# Patient Record
Sex: Male | Born: 1965 | Race: Black or African American | Hispanic: No | Marital: Married | Smoking: Current every day smoker
Health system: Southern US, Community
[De-identification: ages and names within clinical notes are randomized; demographics above are authoritative.]

## PROBLEM LIST (undated history)

## (undated) ENCOUNTER — Ambulatory Visit (HOSPITAL_COMMUNITY): Discharge status: Absent without leave | Payer: BLUE CROSS/BLUE SHIELD

## (undated) ENCOUNTER — Ambulatory Visit (HOSPITAL_COMMUNITY): Admission: EM | Disposition: A | Discharge status: Home / Self Care | Payer: BC Managed Care – PPO

## (undated) DIAGNOSIS — I10 Essential (primary) hypertension: Secondary | ICD-10-CM

## (undated) HISTORY — DX: Essential (primary) hypertension: I10

---

## 1997-11-14 ENCOUNTER — Encounter: Admission: RE | Admit: 1997-11-14 | Discharge: 1997-11-14 | Payer: Self-pay | Admitting: Family Medicine

## 1998-07-04 ENCOUNTER — Encounter: Admission: RE | Admit: 1998-07-04 | Discharge: 1998-07-04 | Payer: Self-pay | Admitting: Sports Medicine

## 1998-08-02 ENCOUNTER — Encounter: Admission: RE | Admit: 1998-08-02 | Discharge: 1998-08-02 | Payer: Self-pay | Admitting: Family Medicine

## 2000-06-12 ENCOUNTER — Encounter: Admission: RE | Admit: 2000-06-12 | Discharge: 2000-06-12 | Payer: Self-pay | Admitting: Family Medicine

## 2000-06-18 ENCOUNTER — Encounter: Admission: RE | Admit: 2000-06-18 | Discharge: 2000-06-18 | Payer: Self-pay | Admitting: Family Medicine

## 2000-11-08 ENCOUNTER — Emergency Department (HOSPITAL_COMMUNITY): Admission: EM | Admit: 2000-11-08 | Discharge: 2000-11-08 | Payer: Self-pay | Admitting: Emergency Medicine

## 2000-12-04 ENCOUNTER — Encounter: Admission: RE | Admit: 2000-12-04 | Discharge: 2000-12-04 | Payer: Self-pay | Admitting: Family Medicine

## 2001-12-11 ENCOUNTER — Encounter: Admission: RE | Admit: 2001-12-11 | Discharge: 2001-12-11 | Payer: Self-pay | Admitting: Family Medicine

## 2002-09-21 ENCOUNTER — Encounter: Admission: RE | Admit: 2002-09-21 | Discharge: 2002-09-21 | Payer: Self-pay | Admitting: Family Medicine

## 2002-10-05 ENCOUNTER — Encounter: Admission: RE | Admit: 2002-10-05 | Discharge: 2002-10-05 | Payer: Self-pay | Admitting: Family Medicine

## 2003-11-30 ENCOUNTER — Encounter: Admission: RE | Admit: 2003-11-30 | Discharge: 2003-11-30 | Payer: Self-pay | Admitting: Sports Medicine

## 2004-02-11 ENCOUNTER — Emergency Department (HOSPITAL_COMMUNITY): Admission: EM | Admit: 2004-02-11 | Discharge: 2004-02-11 | Payer: Self-pay | Admitting: Family Medicine

## 2005-06-07 ENCOUNTER — Ambulatory Visit: Payer: Self-pay | Admitting: Family Medicine

## 2005-06-18 ENCOUNTER — Encounter: Admission: RE | Admit: 2005-06-18 | Discharge: 2005-06-18 | Payer: Self-pay | Admitting: Sports Medicine

## 2005-08-27 ENCOUNTER — Ambulatory Visit: Payer: Self-pay | Admitting: Family Medicine

## 2005-10-25 ENCOUNTER — Emergency Department (HOSPITAL_COMMUNITY): Admission: EM | Admit: 2005-10-25 | Discharge: 2005-10-25 | Payer: Self-pay | Admitting: Family Medicine

## 2006-06-26 DIAGNOSIS — F17200 Nicotine dependence, unspecified, uncomplicated: Secondary | ICD-10-CM | POA: Insufficient documentation

## 2006-06-26 DIAGNOSIS — Q539 Undescended testicle, unspecified: Secondary | ICD-10-CM

## 2006-08-21 ENCOUNTER — Emergency Department (HOSPITAL_COMMUNITY): Admission: EM | Admit: 2006-08-21 | Discharge: 2006-08-21 | Payer: Self-pay | Admitting: Family Medicine

## 2008-07-18 ENCOUNTER — Ambulatory Visit: Payer: Self-pay | Admitting: Internal Medicine

## 2008-07-18 ENCOUNTER — Encounter (INDEPENDENT_AMBULATORY_CARE_PROVIDER_SITE_OTHER): Payer: Self-pay | Admitting: Nurse Practitioner

## 2008-07-18 DIAGNOSIS — I10 Essential (primary) hypertension: Secondary | ICD-10-CM | POA: Insufficient documentation

## 2008-08-22 ENCOUNTER — Ambulatory Visit: Payer: Self-pay | Admitting: Nurse Practitioner

## 2008-08-22 DIAGNOSIS — I498 Other specified cardiac arrhythmias: Secondary | ICD-10-CM

## 2008-08-22 DIAGNOSIS — H6120 Impacted cerumen, unspecified ear: Secondary | ICD-10-CM

## 2008-08-23 ENCOUNTER — Encounter (INDEPENDENT_AMBULATORY_CARE_PROVIDER_SITE_OTHER): Payer: Self-pay | Admitting: Nurse Practitioner

## 2008-08-23 ENCOUNTER — Telehealth (INDEPENDENT_AMBULATORY_CARE_PROVIDER_SITE_OTHER): Payer: Self-pay | Admitting: Nurse Practitioner

## 2008-08-29 ENCOUNTER — Ambulatory Visit: Payer: Self-pay | Admitting: Nurse Practitioner

## 2008-08-30 ENCOUNTER — Encounter (INDEPENDENT_AMBULATORY_CARE_PROVIDER_SITE_OTHER): Payer: Self-pay | Admitting: Nurse Practitioner

## 2008-08-31 DIAGNOSIS — Z8619 Personal history of other infectious and parasitic diseases: Secondary | ICD-10-CM

## 2008-08-31 DIAGNOSIS — A5400 Gonococcal infection of lower genitourinary tract, unspecified: Secondary | ICD-10-CM

## 2008-08-31 LAB — CONVERTED CEMR LAB
Chlamydia, Swab/Urine, PCR: POSITIVE — AB
GC Probe Amp, Urine: POSITIVE — AB
HDL: 44 mg/dL (ref >39)
Total CHOL/HDL Ratio: 4.1

## 2008-09-02 ENCOUNTER — Encounter (INDEPENDENT_AMBULATORY_CARE_PROVIDER_SITE_OTHER): Payer: Self-pay | Admitting: Nurse Practitioner

## 2008-09-05 ENCOUNTER — Ambulatory Visit: Payer: Self-pay | Admitting: *Deleted

## 2008-09-19 ENCOUNTER — Ambulatory Visit: Payer: Self-pay | Admitting: Nurse Practitioner

## 2009-02-06 ENCOUNTER — Ambulatory Visit: Payer: Self-pay | Admitting: Physician Assistant

## 2009-02-06 DIAGNOSIS — J329 Chronic sinusitis, unspecified: Secondary | ICD-10-CM | POA: Insufficient documentation

## 2009-02-06 DIAGNOSIS — R110 Nausea: Secondary | ICD-10-CM | POA: Insufficient documentation

## 2009-02-06 DIAGNOSIS — N342 Other urethritis: Secondary | ICD-10-CM | POA: Insufficient documentation

## 2009-02-06 LAB — CONVERTED CEMR LAB
Bilirubin Urine: NEGATIVE
Glucose, Urine, Semiquant: NEGATIVE
Ketones, urine, test strip: NEGATIVE
Nitrite: NEGATIVE
Specific Gravity, Urine: >=1.03
Urobilinogen, UA: 0.2
pH: 5.5

## 2009-02-10 LAB — CONVERTED CEMR LAB: GC Probe Amp, Urine: NEGATIVE

## 2010-02-01 ENCOUNTER — Encounter (INDEPENDENT_AMBULATORY_CARE_PROVIDER_SITE_OTHER): Payer: Self-pay | Admitting: *Deleted

## 2010-02-01 ENCOUNTER — Telehealth: Payer: Self-pay | Admitting: Physician Assistant

## 2010-02-06 ENCOUNTER — Telehealth: Payer: Self-pay | Admitting: Physician Assistant

## 2010-02-07 ENCOUNTER — Encounter (INDEPENDENT_AMBULATORY_CARE_PROVIDER_SITE_OTHER): Payer: Self-pay | Admitting: Nurse Practitioner

## 2010-02-13 ENCOUNTER — Telehealth: Payer: Self-pay | Admitting: Physician Assistant

## 2010-04-27 ENCOUNTER — Ambulatory Visit: Payer: Self-pay | Admitting: Nurse Practitioner

## 2010-04-27 ENCOUNTER — Encounter (INDEPENDENT_AMBULATORY_CARE_PROVIDER_SITE_OTHER): Payer: Self-pay | Admitting: Nurse Practitioner

## 2010-04-27 DIAGNOSIS — M25569 Pain in unspecified knee: Secondary | ICD-10-CM

## 2010-04-27 LAB — CONVERTED CEMR LAB
ALT: 19 units/L (ref 0–53)
AST: 19 units/L (ref 0–37)
Albumin: 4.5 g/dL (ref 3.5–5.2)
Basophils Absolute: 0 10*3/uL (ref 0.0–0.1)
Bilirubin Urine: NEGATIVE
Calcium: 9.6 mg/dL (ref 8.4–10.5)
Chloride: 104 meq/L (ref 96–112)
Creatinine, Ser: 0.87 mg/dL (ref 0.40–1.50)
Eosinophils Absolute: 0.2 10*3/uL (ref 0.0–0.7)
Eosinophils Relative: 3 % (ref 0–5)
Hemoglobin: 14.3 g/dL (ref 13.0–17.0)
Lymphocytes Relative: 43 % (ref 12–46)
MCHC: 32.4 g/dL (ref 30.0–36.0)
Monocytes Absolute: 0.6 10*3/uL (ref 0.1–1.0)
Neutrophils Relative %: 42 % — ABNORMAL LOW (ref 43–77)
Platelets: 324 10*3/uL (ref 150–400)
Potassium: 4.1 meq/L (ref 3.5–5.3)
Protein, U semiquant: 30
RDW: 13.6 % (ref 11.5–15.5)
Sodium: 139 meq/L (ref 135–145)
Total Bilirubin: 0.4 mg/dL (ref 0.3–1.2)
Total Protein: 7.9 g/dL (ref 6.0–8.3)
WBC: 5.3 10*3/uL (ref 4.0–10.5)

## 2010-05-03 ENCOUNTER — Encounter (INDEPENDENT_AMBULATORY_CARE_PROVIDER_SITE_OTHER): Payer: Self-pay | Admitting: Nurse Practitioner

## 2010-05-25 ENCOUNTER — Ambulatory Visit: Admit: 2010-05-25 | Payer: Self-pay | Admitting: Nurse Practitioner

## 2010-05-27 LAB — CONVERTED CEMR LAB
AST: 15 units/L (ref 0–37)
Albumin: 4.7 g/dL (ref 3.5–5.2)
Basophils Relative: 0 % (ref 0–1)
Blood in Urine, dipstick: NEGATIVE
Eosinophils Absolute: 0.1 10*3/uL (ref 0.0–0.7)
Glucose, Urine, Semiquant: NEGATIVE
HCT: 42.5 % (ref 39.0–52.0)
Lymphs Abs: 2.4 10*3/uL (ref 0.7–4.0)
MCV: 88 fL (ref 78.0–100.0)
Monocytes Absolute: 0.8 10*3/uL (ref 0.1–1.0)
Monocytes Relative: 12 % (ref 3–12)
Neutro Abs: 3.6 10*3/uL (ref 1.7–7.7)
Nitrite: NEGATIVE
Platelets: 353 10*3/uL (ref 150–400)
Protein, U semiquant: 30
RDW: 13.2 % (ref 11.5–15.5)
Specific Gravity, Urine: 1.02
Total Protein: 8.1 g/dL (ref 6.0–8.3)

## 2010-05-29 NOTE — Progress Notes (Signed)
Summary: Toprol  Phone Note Other Incoming   Summary of Call: Rec'd request for refill on Toprol. Patient not seen since Oct. No refills requested on Toprol since June. He needs appt for f/u on HTN.  Initial call taken by: Tereso Newcomer PA-C,  February 01, 2010 8:02 AM  Follow-up for Phone Call        number is disconnected. ... will mail letter... Arm[place] Shannon  February 01, 2010 2:44 PM

## 2010-05-29 NOTE — Progress Notes (Signed)
Summary: Toprol  Phone Note Outgoing Call   Summary of Call: Please notify [facility] that patient needs appt before Toprol can be refilled.  Initial call taken by: Brynda Rim,  February 13, 2010 8:34 AM  Follow-up for Phone Call        pharmacy is aware Follow-up by: Arm[place] Shannon,  February 13, 2010 10:54 AM

## 2010-05-29 NOTE — Progress Notes (Signed)
Summary: [facility]  Phone Note Refill Request   Refills Requested: Medication #1:  TOPROL XL 25 MG XR24H-TAB One tablet by mouth daily for blood pressure/heart he wants to [facility], [facility]   Initial call taken by: Domenic Polite,  February 06, 2010 12:24 PM  Follow-up for Phone Call        "Code or number is incorrect"- unable to leave message.  Dutch Quint RN  February 06, 2010 2:31 PM  Phone number not valid.  Letter sent.  Dutch Quint RN  February 07, 2010 11:43 AM

## 2010-05-29 NOTE — Letter (Signed)
Summary: *HSN Results Follow up  [facility]  [address] [place], [place] [address]   Phone: [phone]  Fax: [phone]      02/01/2010   Corey Gonzales [address] [place], [place]  [address]   Dear  Mr. Corey Gonzales,                            ____S.Drinkard,FNP   ____D. Gore,FNP       ____B. McPherson,MD   ____V. Rankins,MD    ____E. Mulberry,MD    ____N. Daphine Deutscher, FNP  ____D. Reche Dixon, MD    ____K. Philipp Deputy, MD    ____Other     This letter is to inform you that your recent test(s):  _______Pap Smear    _______Lab Test     _______X-ray    _______ is within acceptable limits  _______ requires a medication change  _______ requires a follow-up lab visit  ___X____ requires a follow-up visit with your Corey Gonzales   Comments:  We have been trying to reach you.  Please give the office a call at your earliest convenience.       _________________________________________________________ If you have any questions, please contact our office                     Sincerely,  Arm[place] Shannon [facility]

## 2010-05-29 NOTE — Letter (Signed)
Summary: Generic Letter  [facility]  [address] [place], [place] [address]   Phone: [phone]  Fax: [phone]        02/07/2010  ELJAY LAVE [address] [place], [place]  [address]  Dear Mr. Zinn,  We have been unable to contact you by telephone.  Please call our office, at your earliest convenience, so that we may speak with you.   Sincerely,   Dutch Quint RN

## 2010-05-31 NOTE — Letter (Signed)
Summary: *HSN Results Follow up  [facility]  [address] [place], [place] [address]   Phone: [phone]  Fax: [phone]      05/03/2010   Corey Gonzales [address] [place], [place]  [address]   Dear  Mr. JJESUS DINGLEY,                            ____S.Drinkard,FNP   ____D. Gore,FNP       ____B. McPherson,MD   ____V. Rankins,MD    ____E. Mulberry,MD    __X__N. Daphine Deutscher, FNP  ____D. Reche Dixon, MD    ____K. Philipp Deputy, MD    ____Other     This letter is to inform you that your recent test(s):  _______Pap Smear    ___X____Lab Test     _______X-ray    ___X____ is within acceptable limits  _______ requires a medication change  _______ requires a follow-up lab visit  _______ requires a follow-up visit with your provider   Comments: Labs done during recent office visit are normal.       _________________________________________________________ If you have any questions, please contact our office [phone].                    Sincerely,    Lehman Prom FNP [facility]

## 2010-05-31 NOTE — Assessment & Plan Note (Signed)
Summary: HTN   Vital Signs:  Patient profile:   45 year old male Weight:      211.4 pounds BMI:     28.38 Temp:     97.8 degrees F oral Pulse rate:   72 / minute Pulse rhythm:   regular Resp:     16 per minute BP sitting:   140 / 90  (left arm) Cuff size:   large  Vitals Entered By: Levon Hedger (April 27, 2010 11:59 AM)  Nutrition Counseling: Patient's BMI is greater than 25 and therefore counseled on weight management options. CC: follow-up visit HTN..circulation in leg feels like it is asleep with pai in the knee and popping, Hypertension Management Is Patient Diabetic? No Pain Assessment Patient in pain? no       Does patient need assistance? Functional Status Self care Ambulation Normal   CC:  follow-up visit HTN..circulation in leg feels like it is asleep with pai in the knee and popping and Hypertension Management.  History of Present Illness:  Pt into the office to f/u on HTN Pt has eaten today so was not able to get cholesterol   Hypertension History:      He denies headache, chest pain, and palpitations.  no current medications - pt as previously taking toprol but bp normalized so he quit.  he also admits that toprol caused some ED symptoms.        Positive major cardiovascular risk factors include hypertension and current tobacco user.  Negative major cardiovascular risk factors include male age less than 70 years old and no history of diabetes or hyperlipidemia.        Further assessment for target organ damage reveals no history of ASHD, cardiac end-organ damage (CHF/LVH), stroke/TIA, peripheral vascular disease, renal insufficiency, or hypertensive retinopathy.     Allergies (verified): No Known Drug Allergies  Review of Systems CV:  Denies chest pain or discomfort. Resp:  Denies cough. GI:  Denies abdominal pain, nausea, and vomiting. MS:  Complains of joint pain; right knee - ongoing pain behind knee cap and popping with flexion.  Physical  Exam  General:  alert.   Head:  normocephalic.   Lungs:  normal breath sounds.   Heart:  normal rate and regular rhythm.   Abdomen:  normal bowel sounds.   Msk:  normal ROM.   Neurologic:  alert & oriented X3.   Skin:  multiple tattoos Psych:  Oriented X3.     Impression & Recommendations:  Problem # 1:  HYPERTENSION, BENIGN ESSENTIAL (ICD-401.1) DASH diet The following medications were removed from the medication list:    Toprol Xl 25 Mg Xr24h-tab (Metoprolol succinate) ..... One tablet by mouth daily for blood pressure/heart  Problem # 2:  KNEE PAIN (ICD-719.46) advised pt this is likely overuse may take tylenol as needed   Other Orders: T-Comprehensive Metabolic Panel (11914-78295) T-CBC w/Diff (62130-86578) Rapid HIV  (46962) T-TSH (95284-13244) UA Dipstick w/o Micro (manual) (01027) T-Urine Microalbumin w/creat. ratio [phone])  Hypertension Assessment/Plan:      The patient's hypertensive risk group is category B: At least one risk factor (excluding diabetes) with no target organ damage.  His calculated 10 year risk of coronary heart disease is 14 %.  Today's blood pressure is 140/90.  His blood pressure goal is < 140/90.  Patient Instructions: 1)  Schedule an appointment with the Triage Nurse in 4 weeks. 2)  Come fasting after midnight before this visit. 3)  No food but you may drink water. 4)  You will need blood pressure check and lipids. 5)  Goal 135/85. 6)  If still elevated you will need medications. 7)  You have been given the flu vaccine today.     Orders Added: 1)  Est. Patient Level III [60109] 2)  T-Comprehensive Metabolic Panel [80053-22900] 3)  T-CBC w/Diff [32355-73220] 4)  Rapid HIV  [92370] 5)  T-TSH [25427-06237] 6)  UA Dipstick w/o Micro (manual) [81002] 7)  T-Urine Microalbumin w/creat. ratio [[id number]]      Laboratory Results   Urine Tests  Date/Time Received: April 27, 2010 12:24 PM   Routine Urinalysis     Color: lt. yellow Appearance: Clear Glucose: negative   (Normal Range: Negative) Bilirubin: negative   (Normal Range: Negative) Ketone: negative   (Normal Range: Negative) Spec. Gravity: >=1.030   (Normal Range: 1.003-1.035) Blood: negative   (Normal Range: Negative) pH: 5.0   (Normal Range: 5.0-8.0) Protein: 30   (Normal Range: Negative) Urobilinogen: 0.2   (Normal Range: 0-1) Nitrite: negative   (Normal Range: Negative) Leukocyte Esterace: trace   (Normal Range: Negative)      Other Tests  Rapid HIV: negative    Laboratory Results   Urine Tests    Routine Urinalysis   Color: lt. yellow Appearance: Clear Glucose: negative   (Normal Range: Negative) Bilirubin: negative   (Normal Range: Negative) Ketone: negative   (Normal Range: Negative) Spec. Gravity: >=1.030   (Normal Range: 1.003-1.035) Blood: negative   (Normal Range: Negative) pH: 5.0   (Normal Range: 5.0-8.0) Protein: 30   (Normal Range: Negative) Urobilinogen: 0.2   (Normal Range: 0-1) Nitrite: negative   (Normal Range: Negative) Leukocyte Esterace: trace   (Normal Range: Negative)      Other Tests  Rapid HIV: negative

## 2014-03-18 ENCOUNTER — Emergency Department (INDEPENDENT_AMBULATORY_CARE_PROVIDER_SITE_OTHER)
Admission: EM | Admit: 2014-03-18 | Discharge: 2014-03-18 | Disposition: A | Discharge status: Home / Self Care | Payer: Self-pay | Source: Home / Self Care | Attending: Family Medicine | Admitting: Family Medicine

## 2014-03-18 ENCOUNTER — Encounter (HOSPITAL_COMMUNITY): Payer: Self-pay | Admitting: Emergency Medicine

## 2014-03-18 DIAGNOSIS — G510 Bell's palsy: Secondary | ICD-10-CM

## 2014-03-18 MED ORDER — PREDNISONE 20 MG PO TABS: ORAL_TABLET | ORAL | Status: DC

## 2014-03-18 MED ORDER — POLYETHYL GLYCOL-PROPYL GLYCOL 0.4-0.3 % OP SOLN: OPHTHALMIC | Status: DC

## 2014-03-18 MED ORDER — PREDNISONE 20 MG PO TABS
80.0000 mg | ORAL_TABLET | Freq: Once | ORAL | Status: AC
  Administered 2014-03-18: 80 mg via ORAL

## 2014-03-18 MED ORDER — VALACYCLOVIR HCL 1 G PO TABS: 1000.0000 mg | ORAL_TABLET | Freq: Three times a day (TID) | ORAL | Status: AC

## 2014-03-18 MED ORDER — PREDNISONE 20 MG PO TABS
ORAL_TABLET | ORAL | Status: AC
  Filled 2014-03-18: qty 4

## 2014-03-18 NOTE — Discharge Instructions (Signed)
Bell's Palsy °Bell's palsy is a condition in which the muscles on one side of the face cannot move (paralysis). This is because the nerves in the face are paralyzed. It is most often thought to be caused by a virus. The virus causes swelling of the nerve that controls movement on one side of the face. The nerve travels through a tight space surrounded by bone. When the nerve swells, it can be compressed by the bone. This results in damage to the protective covering around the nerve. This damage interferes with how the nerve communicates with the muscles of the face. As a result, it can cause weakness or paralysis of the facial muscles.  °Injury (trauma), tumor, and surgery may cause Bell's palsy, but most of the time the cause is unknown. It is a relatively common condition. It starts suddenly (abrupt onset) with the paralysis usually ending within 2 days. Bell's palsy is not dangerous. But because the eye does not close properly, you may need care to keep the eye from getting dry. This can include splinting (to keep the eye shut) or moistening with artificial tears. Bell's palsy very seldom occurs on both sides of the face at the same time. °SYMPTOMS  °· Eyebrow sagging. °· Drooping of the eyelid and corner of the mouth. °· Inability to close one eye. °· Loss of taste on the front of the tongue. °· Sensitivity to loud noises. °TREATMENT  °The treatment is usually non-surgical. If the patient is seen within the first 24 to 48 hours, a short course of steroids may be prescribed, in an attempt to shorten the length of the condition. Antiviral medicines may also be used with the steroids, but it is unclear if they are helpful.  °You will need to protect your eye, if you cannot close it. The cornea (clear covering over your eye) will become dry and can be damaged. Artificial tears can be used to keep your eye moist. Glasses or an eye patch should be worn to protect your eye. °PROGNOSIS  °Recovery is variable, ranging  from days to months. Although the problem usually goes away completely (about 80% of cases resolve), predicting the outcome is impossible. Most people improve within 3 weeks of when the symptoms began. Improvement may continue for 3 to 6 months. A small number of people have moderate to severe weakness that is permanent.  °HOME CARE INSTRUCTIONS  °· If your caregiver prescribed medication to reduce swelling in the nerve, use as directed. Do not stop taking the medication unless directed by your caregiver. °· Use moisturizing eye drops as needed to prevent drying of your eye, as directed by your caregiver. °· Protect your eye, as directed by your caregiver. °· Use facial massage and exercises, as directed by your caregiver. °· Perform your normal activities, and get your normal rest. °SEEK IMMEDIATE MEDICAL CARE IF:  °· There is pain, redness or irritation in the eye. °· You or your child has an oral temperature above 102° F (38.9° C), not controlled by medicine. °MAKE SURE YOU:  °· Understand these instructions. °· Will watch your condition. °· Will get help right away if you are not doing well or get worse. °Document Released: 04/15/2005 Document Revised: 07/08/2011 Document Reviewed: 07/23/2013 °ExitCare® Patient Information ©2015 [employer]. This information is not intended to replace advice given to you by your health care provider. Make sure you discuss any questions you have with your health care provider. ° °

## 2014-03-18 NOTE — ED Provider Notes (Signed)
CSN: [id number]     Arrival date & time 03/18/14  1830 History   First MD Initiated Contact with Patient 03/18/14 1850     Chief Complaint  Patient presents with  . Facial Droop   (Consider location/radiation/quality/duration/timing/severity/associated sxs/prior Treatment) HPI        48 year old male presents for evaluation of possible Bell's palsy. 2 days he has right-sided facial droop, inability to close his right eye, and inability to close his mouth on the right side. This is been constant, not worsening nor improving. He feels like his eyes watering and he has slightly blurred vision as well. He also feels like his taste sensation is affected, everything tastes bitter. He notes that 2 weeks ago he had a sensation of numbness in his right arm and leg that resolved quickly without any intervention. He smokes one pack of cigarettes daily for 30 years. Currently denies any numbness or weakness in his extremities. He is able to walk without difficulty. He denies any confusion. Feels completely normal except for that his facial muscles are not working appropriately. Hearing is not affected.  History reviewed. No pertinent past medical history. History reviewed. No pertinent past surgical history. No family history on file. History  Substance Use Topics  . Smoking status: Current Every Day Smoker -- 1.00 packs/day    Types: Cigarettes  . Smokeless tobacco: Not on file  . Alcohol Use: Yes    Review of Systems  Neurological: Positive for facial asymmetry and numbness. Negative for dizziness, seizures, speech difficulty and weakness.  All other systems reviewed and are negative.   Allergies  Review of patient's allergies indicates no known allergies.  Home Medications   Prior to Admission medications   Medication Sig Start Date End Date Taking? Authorizing Provider  Polyethyl Glycol-Propyl Glycol (SYSTANE) 0.4-0.3 % SOLN 1-2 drops hourly as needed 03/18/14   Graylon GoodZachary H Baker, PA-C   predniSONE (DELTASONE) 20 MG tablet 4 tablets daily for 6 days, then decrease dose by 1 tablet per day 03/18/14   Graylon GoodZachary H Baker, PA-C  valACYclovir (VALTREX) 1000 MG tablet Take 1 tablet (1,000 mg total) by mouth 3 (three) times daily. 03/18/14 04/01/14  Adrian BlackwaterZachary H Baker, PA-C   BP 147/91 mmHg  Pulse 93  Temp(Src) 98.3 F (36.8 C) (Oral)  Resp 18  SpO2 97% Physical Exam  Constitutional: He is oriented to person, place, and time. He appears well-developed and well-nourished. No distress.  HENT:  Head: Normocephalic.  Neck: Normal range of motion. Neck supple.  Cardiovascular: Normal rate, regular rhythm, normal heart sounds and intact distal pulses.   Pulmonary/Chest: Effort normal and breath sounds normal. No respiratory distress. He has no wheezes. He has no rales.  Neurological: He is alert and oriented to person, place, and time. He has normal strength and normal reflexes. A cranial nerve deficit is present. No sensory deficit. He exhibits normal muscle tone. He displays a negative Romberg sign. Coordination and gait normal. GCS eye subscore is 4. GCS verbal subscore is 5. GCS motor subscore is 6.  Unable to close the right eye fully. The right eyebrow strength is diminished. Weakness of facial muscles on the right Visual acuity is 20/20 bilaterally. Pupils are equal round and reactive to light and accommodating. Extraocular movements are intact. Pupillary light reflex is equal bilaterally. Tongue protrusion is midline. Sensation in all quadrants of the face is equal bilaterally. Corneal reflexes intact. Hearing is equal bilaterally. Head rotation and shoulder elevation is full strength, 5 out of 5  bilaterally. Strength of tongue movements. Gag reflex is intact.   Normal gait. Heel toe gait is normal. Heel-to-shin is normal. Rapid alternating hand movements and finger to nose is both normal.  Alert and oriented to person place and time. Able to name 3 objects at 5 minutes. Able to draw a  clock face. Difficulty with performing serial sevens. Able to repeat no ifs, ands or buts.   Skin: Skin is warm and dry. No rash noted. He is not diaphoretic.  Psychiatric: He has a normal mood and affect. Judgment normal.  Nursing note and vitals reviewed.   ED Course  Procedures (including critical care time) Labs Review Labs Reviewed - No data to display  Imaging Review No results found.   MDM   1. Bell's palsy    Consistent with Bell's palsy. This would be considered moderately severe. Treat with Valtrex and high-dose prednisone. He was given 80 mg of oral prednisone here. Strict Return precautions were discussed with the patient. Follow-up when necessary if not improving in a few days  Meds ordered this encounter  Medications  . predniSONE (DELTASONE) tablet 80 mg    Sig:   . predniSONE (DELTASONE) 20 MG tablet    Sig: 4 tablets daily for 6 days, then decrease dose by 1 tablet per day    Dispense:  30 tablet    Refill:  0    Order Specific Question:  Supervising Provider    Answer:  Linna HoffKINDL, JAMES D [phone][[id number]]  . valACYclovir (VALTREX) 1000 MG tablet    Sig: Take 1 tablet (1,000 mg total) by mouth 3 (three) times daily.    Dispense:  21 tablet    Refill:  0    Order Specific Question:  Supervising Provider    Answer:  Linna HoffKINDL, JAMES D [phone][[id number]]  . Polyethyl Glycol-Propyl Glycol (SYSTANE) 0.4-0.3 % SOLN    Sig: 1-2 drops hourly as needed    Dispense:  10 mL    Refill:  0    Order Specific Question:  Supervising Provider    Answer:  Linna HoffKINDL, JAMES D [[id number]]     Graylon GoodZachary H Baker, PA-C 03/18/14 1946   Medical screening examination/treatment/procedure(s) were performed by a resident physician or non-physician practitioner and as the supervising physician I was immediately available for consultation/collaboration.  Clementeen GrahamEvan Mehr Depaoli, MD    Rodolph BongEvan S Shatara Stanek, MD 03/19/14 [phone]

## 2014-03-18 NOTE — ED Notes (Signed)
Pt c/o lack of taste, headache, and right facial drooping onset 3 days ago. Reports he had right arm numbness and tingling and weakness in the right leg. Symptoms have subsided. Donnetta Hutching-YRoberson, CMA

## 2015-06-16 ENCOUNTER — Emergency Department (INDEPENDENT_AMBULATORY_CARE_PROVIDER_SITE_OTHER): Payer: BLUE CROSS/BLUE SHIELD

## 2015-06-16 ENCOUNTER — Emergency Department (HOSPITAL_COMMUNITY)
Admission: EM | Admit: 2015-06-16 | Discharge: 2015-06-16 | Disposition: A | Discharge status: Home / Self Care | Payer: BLUE CROSS/BLUE SHIELD | Source: Home / Self Care | Attending: Family Medicine | Admitting: Family Medicine

## 2015-06-16 ENCOUNTER — Encounter (HOSPITAL_COMMUNITY): Payer: Self-pay | Admitting: Emergency Medicine

## 2015-06-16 DIAGNOSIS — S39012A Strain of muscle, fascia and tendon of lower back, initial encounter: Secondary | ICD-10-CM

## 2015-06-16 MED ORDER — DICLOFENAC POTASSIUM 50 MG PO TABS: 50.0000 mg | ORAL_TABLET | Freq: Three times a day (TID) | ORAL | Status: DC

## 2015-06-16 MED ORDER — BACLOFEN 10 MG PO TABS: 10.0000 mg | ORAL_TABLET | Freq: Three times a day (TID) | ORAL | Status: DC

## 2015-06-16 NOTE — ED Notes (Signed)
Reports lower back pain for a week.  Pain has worsened for 2 days.  No pain, no tingling in legs.  Pain is in lower back and hips.  Patient was lifing heavy weight when he first noticed twinges of pain.  Patient has no history of back issues

## 2015-06-16 NOTE — ED Provider Notes (Addendum)
CSN: [id number]     Arrival date & time 06/16/15  1310 History   First MD Initiated Contact with Patient 06/16/15 1412     Chief Complaint  Patient presents with  . Back Pain   (Consider location/radiation/quality/duration/timing/severity/associated sxs/prior Treatment) Patient is a 50 y.o. male presenting with back pain. The history is provided by the patient.  Back Pain Location:  Lumbar spine Quality:  Stabbing, burning, stiffness and cramping Stiffness is present:  In the morning Radiates to:  Does not radiate Pain severity:  Moderate Onset quality:  Gradual Duration:  1 week Progression:  Worsening Chronicity:  New Context: lifting heavy objects and occupational injury   Worsened by:  Bending and standing Associated symptoms: no abdominal pain, no bladder incontinence, no bowel incontinence, no dysuria, no fever, no leg pain, no numbness, no paresthesias, no pelvic pain, no tingling and no weakness     History reviewed. No pertinent past medical history. History reviewed. No pertinent past surgical history. No family history on file. Social History  Substance Use Topics  . Smoking status: Current Every Day Smoker -- 1.00 packs/day    Types: Cigarettes  . Smokeless tobacco: None  . Alcohol Use: Yes    Review of Systems  Constitutional: Negative.  Negative for fever.  Gastrointestinal: Negative.  Negative for abdominal pain and bowel incontinence.  Genitourinary: Negative.  Negative for bladder incontinence, dysuria and pelvic pain.  Musculoskeletal: Positive for back pain. Negative for myalgias, joint swelling and gait problem.  Neurological: Negative for tingling, weakness, numbness and paresthesias.  All other systems reviewed and are negative.   Allergies  Review of patient's allergies indicates no known allergies.  Home Medications   Prior to Admission medications   Medication Sig Start Date End Date Taking? Authorizing Provider  baclofen (LIORESAL) 10 MG  tablet Take 1 tablet (10 mg total) by mouth 3 (three) times daily. 06/16/15   Linna Hoff, MD  diclofenac (CATAFLAM) 50 MG tablet Take 1 tablet (50 mg total) by mouth 3 (three) times daily. For back pain 06/16/15   Linna Hoff, MD  Polyethyl Glycol-Propyl Glycol (SYSTANE) 0.4-0.3 % SOLN 1-2 drops hourly as needed Patient not taking: Reported on 06/16/2015 03/18/14   Graylon Good, PA-C  predniSONE (DELTASONE) 20 MG tablet 4 tablets daily for 6 days, then decrease dose by 1 tablet per day 03/18/14   Graylon Good, PA-C   Meds Ordered and Administered this Visit  Medications - No data to display  BP 138/91 mmHg  Pulse 97  Temp(Src) 99.2 F (37.3 C) (Oral)  Resp 16  SpO2 98% No data found.   Physical Exam  Constitutional: He is oriented to person, place, and time. He appears well-developed and well-nourished. He appears distressed.  Abdominal: Soft. There is no tenderness.  Musculoskeletal: He exhibits tenderness.       Lumbar back: He exhibits decreased range of motion, tenderness, pain and spasm. He exhibits no edema, no deformity and normal pulse.  Neurological: He is alert and oriented to person, place, and time.  Skin: Skin is warm and dry.  Nursing note and vitals reviewed.   ED Course  Procedures (including critical care time)  Labs Review Labs Reviewed - No data to display  Imaging Review Dg Lumbar Spine Complete  06/16/2015  CLINICAL DATA:  Back pain for a week and a half. EXAM: LUMBAR SPINE - COMPLETE 4+ VIEW COMPARISON:  CT 06/18/2005 FINDINGS: Normal alignment of the lumbar spine. Negative for fracture or pars  defect. Disc spaces are maintained. IMPRESSION: No acute abnormality. Electronically Signed   By: Richarda Overlie M.D.   On: 06/16/2015 15:18   X-rays reviewed and report per radiologist.   Visual Acuity Review  Right Eye Distance:   Left Eye Distance:   Bilateral Distance:    Right Eye Near:   Left Eye Near:    Bilateral Near:         MDM   1.  Back strain, initial encounter        Linna Hoff, MD 06/16/15 2022  Linna Hoff, MD 06/16/15 2024

## 2015-07-03 ENCOUNTER — Emergency Department (INDEPENDENT_AMBULATORY_CARE_PROVIDER_SITE_OTHER)
Admission: EM | Admit: 2015-07-03 | Discharge: 2015-07-03 | Disposition: A | Discharge status: Home / Self Care | Payer: Self-pay | Source: Home / Self Care | Attending: Family Medicine | Admitting: Family Medicine

## 2015-07-03 ENCOUNTER — Encounter (HOSPITAL_COMMUNITY): Payer: Self-pay | Admitting: Emergency Medicine

## 2015-07-03 DIAGNOSIS — S29019A Strain of muscle and tendon of unspecified wall of thorax, initial encounter: Secondary | ICD-10-CM

## 2015-07-03 DIAGNOSIS — S29009A Unspecified injury of muscle and tendon of unspecified wall of thorax, initial encounter: Secondary | ICD-10-CM

## 2015-07-03 DIAGNOSIS — S39012A Strain of muscle, fascia and tendon of lower back, initial encounter: Secondary | ICD-10-CM

## 2015-07-03 DIAGNOSIS — S161XXA Strain of muscle, fascia and tendon at neck level, initial encounter: Secondary | ICD-10-CM

## 2015-07-03 MED ORDER — DICLOFENAC POTASSIUM 50 MG PO TABS: 50.0000 mg | ORAL_TABLET | Freq: Three times a day (TID) | ORAL | Status: DC
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
## 2017-02-02 IMAGING — DX DG LUMBAR SPINE COMPLETE 4+V
5 series · 5 of 5 positions shown · non-contrast
Comparison: CT 06/18/2005

CLINICAL DATA: Back pain for a week and a half.

EXAM:
LUMBAR SPINE - COMPLETE 4+ VIEW

[l-spine ap]
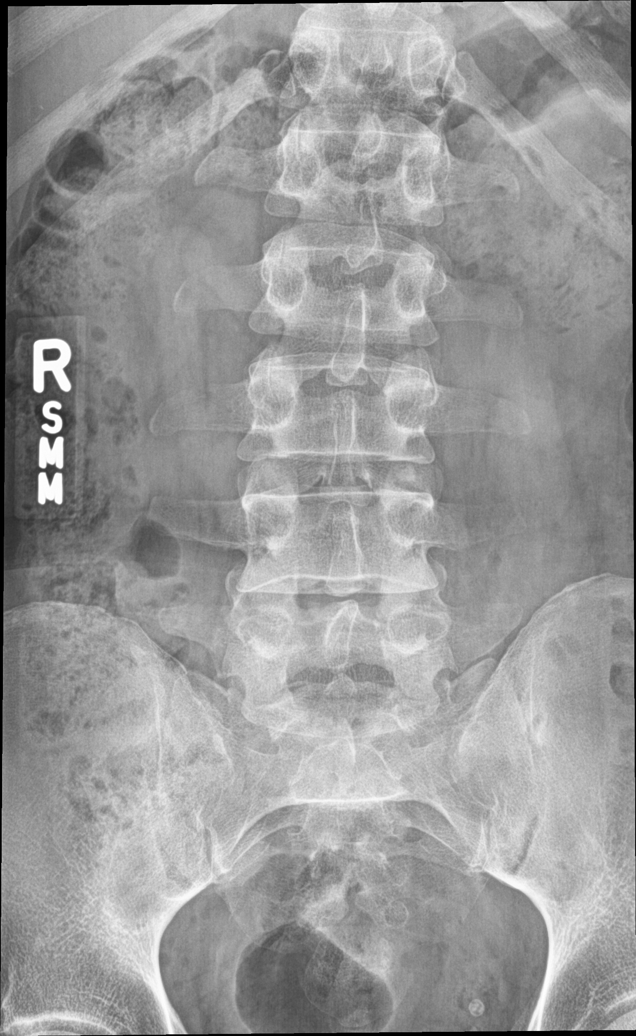

[l-spine obl (1 of 2)]
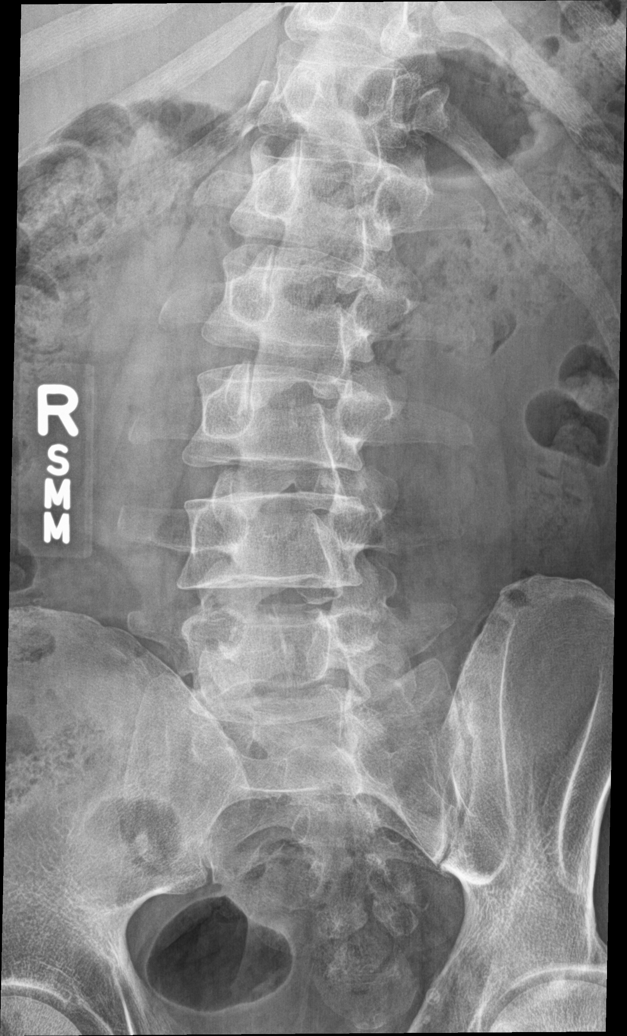

[l-spine obl (2 of 2)]
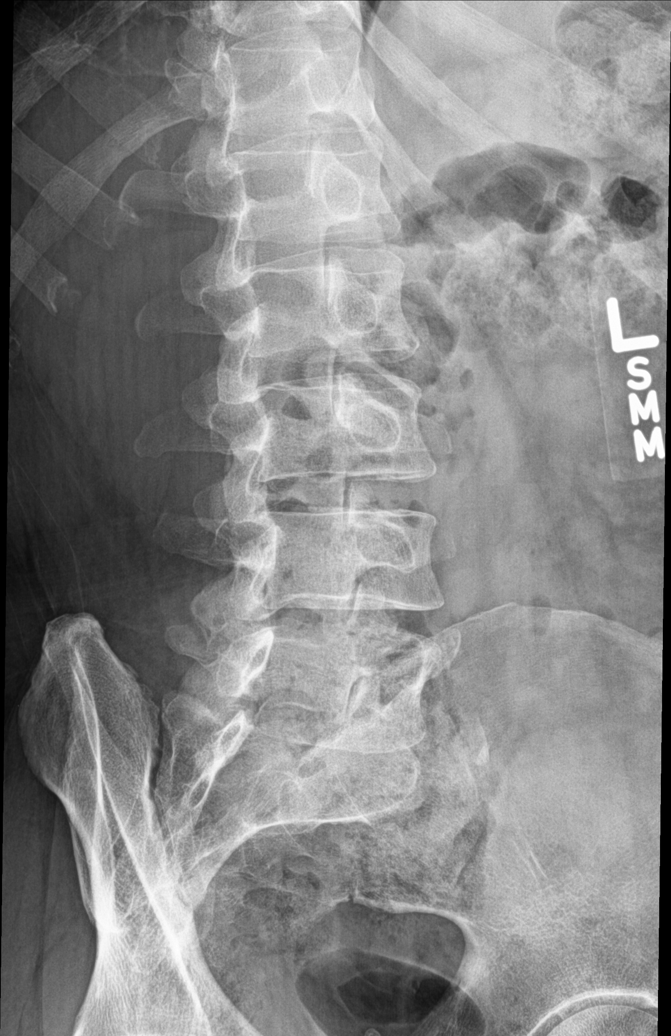

[l-spine lat]
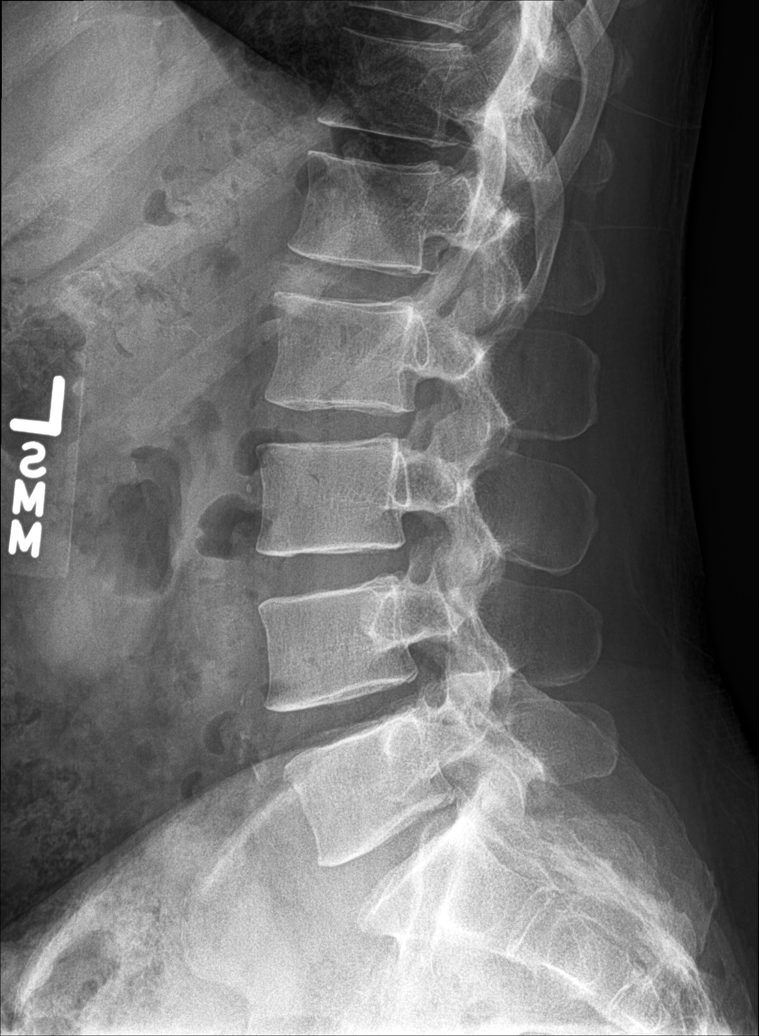

[l-spine spot]
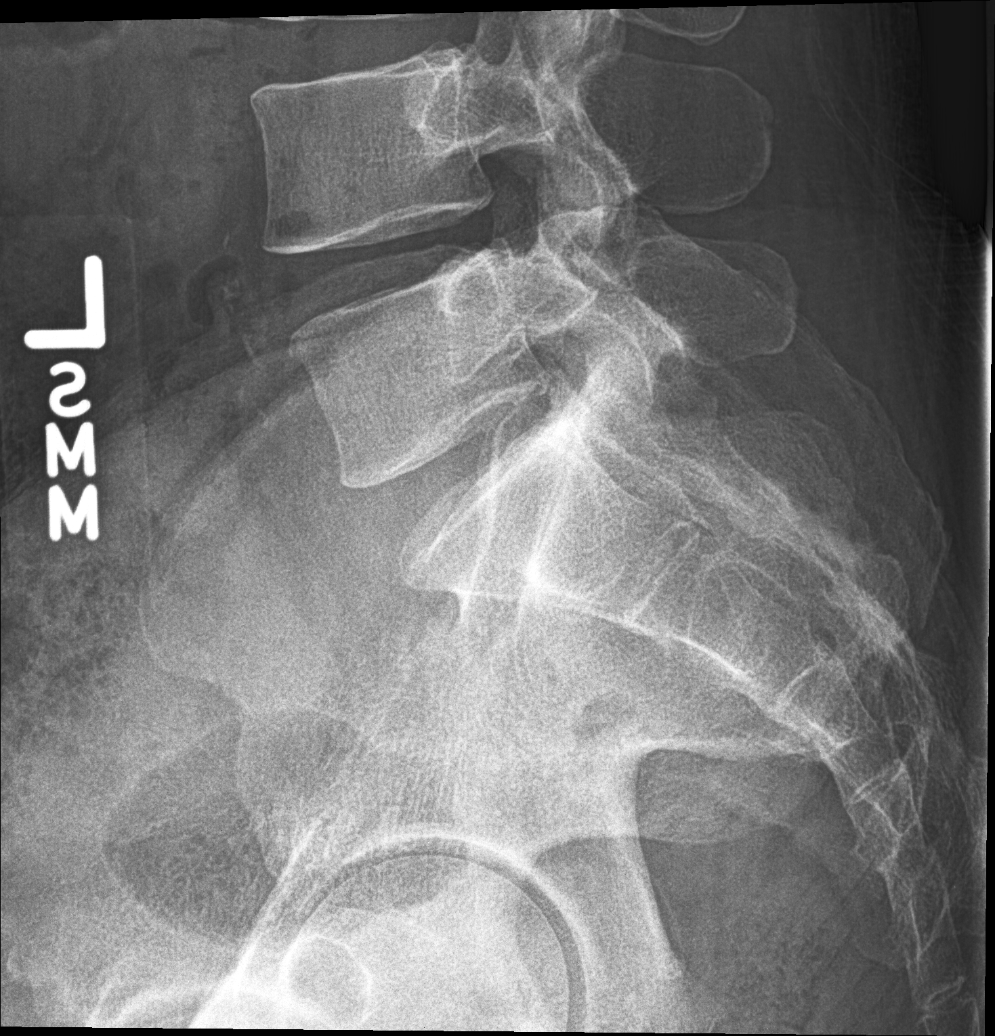

[5 of 5 positions shown; findings below may reference images not displayed]

FINDINGS: Normal alignment of the lumbar spine. Negative for fracture or pars
defect. Disc spaces are maintained.
IMPRESSION: No acute abnormality.

## 2017-12-21 ENCOUNTER — Encounter (HOSPITAL_COMMUNITY): Payer: Self-pay

## 2017-12-21 ENCOUNTER — Ambulatory Visit (HOSPITAL_COMMUNITY)
Admission: EM | Admit: 2017-12-21 | Discharge: 2017-12-21 | Disposition: A | Discharge status: Home / Self Care | Payer: BLUE CROSS/BLUE SHIELD | Attending: Internal Medicine | Admitting: Internal Medicine

## 2017-12-21 ENCOUNTER — Other Ambulatory Visit: Payer: Self-pay

## 2017-12-21 DIAGNOSIS — M7918 Myalgia, other site: Secondary | ICD-10-CM

## 2017-12-21 DIAGNOSIS — M545 Low back pain: Secondary | ICD-10-CM

## 2017-12-21 DIAGNOSIS — S161XXA Strain of muscle, fascia and tendon at neck level, initial encounter: Secondary | ICD-10-CM

## 2017-12-21 DIAGNOSIS — M25512 Pain in left shoulder: Secondary | ICD-10-CM

## 2017-12-21 MED ORDER — IBUPROFEN 800 MG PO TABS: 800.0000 mg | ORAL_TABLET | Freq: Three times a day (TID) | ORAL | 0 refills | Status: DC

## 2017-12-21 MED ORDER — CYCLOBENZAPRINE HCL 10 MG PO TABS: 10.0000 mg | ORAL_TABLET | Freq: Two times a day (BID) | ORAL | 0 refills | Status: DC | PRN
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
## 2017-12-21 NOTE — Discharge Instructions (Addendum)
Use anti-inflammatories for pain/swelling. You may take up to 800 mg Ibuprofen every 8 hours with food. You may supplement Ibuprofen with Tylenol [phone] mg every 8 hours.   You may use flexeril as needed to help with pain. This is a muscle relaxer and causes sedation- please use only at bedtime or when you will be home and not have to drive/work- may begin with 1/2 tablet  Ice neck, may alternate with heating pad to help loosen muscles  I expect pain to worsen over the next 48 hours, followed by gradual improvement over the next 1 to 2 weeks.  Please return here if developing new symptoms, worsening symptoms, numbness in arms, worsening back pain, change in urinary or bowel habits, numbness in lower extremities, headaches, please also follow-up if symptoms not improving in 2 weeks
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
## 2020-08-22 ENCOUNTER — Encounter (HOSPITAL_COMMUNITY): Payer: Self-pay | Admitting: Emergency Medicine

## 2020-08-22 ENCOUNTER — Other Ambulatory Visit: Payer: Self-pay

## 2020-08-22 ENCOUNTER — Ambulatory Visit (HOSPITAL_COMMUNITY)
Admission: EM | Admit: 2020-08-22 | Discharge: 2020-08-22 | Disposition: A | Discharge status: Home / Self Care | Payer: 59 | Attending: Urgent Care | Admitting: Urgent Care

## 2020-08-22 ENCOUNTER — Ambulatory Visit (INDEPENDENT_AMBULATORY_CARE_PROVIDER_SITE_OTHER): Payer: 59

## 2020-08-22 DIAGNOSIS — M5410 Radiculopathy, site unspecified: Secondary | ICD-10-CM | POA: Diagnosis not present

## 2020-08-22 DIAGNOSIS — R202 Paresthesia of skin: Secondary | ICD-10-CM

## 2020-08-22 DIAGNOSIS — I10 Essential (primary) hypertension: Secondary | ICD-10-CM

## 2020-08-22 DIAGNOSIS — M545 Low back pain, unspecified: Secondary | ICD-10-CM | POA: Diagnosis not present

## 2020-08-22 DIAGNOSIS — R030 Elevated blood-pressure reading, without diagnosis of hypertension: Secondary | ICD-10-CM

## 2020-08-22 DIAGNOSIS — R200 Anesthesia of skin: Secondary | ICD-10-CM

## 2020-08-22 MED ORDER — HYDROCHLOROTHIAZIDE 12.5 MG PO TABS: 12.5000 mg | ORAL_TABLET | Freq: Every day | ORAL | 0 refills | Status: DC

## 2020-08-22 MED ORDER — ACETAMINOPHEN 325 MG PO TABS: 650.0000 mg | ORAL_TABLET | Freq: Four times a day (QID) | ORAL | 0 refills | Status: AC | PRN

## 2020-08-22 MED ORDER — TIZANIDINE HCL 4 MG PO TABS: 4.0000 mg | ORAL_TABLET | Freq: Every day | ORAL | 0 refills | Status: DC
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
## 2020-08-22 NOTE — ED Triage Notes (Signed)
Pt presents with left hip and lower back pain xs 3-4 months. States is having numbness and tingling in left leg that comes and goes.

## 2020-08-22 NOTE — Discharge Instructions (Addendum)

## 2020-08-29 ENCOUNTER — Encounter (HOSPITAL_BASED_OUTPATIENT_CLINIC_OR_DEPARTMENT_OTHER): Payer: Self-pay | Admitting: Family Medicine

## 2020-08-29 ENCOUNTER — Other Ambulatory Visit (HOSPITAL_BASED_OUTPATIENT_CLINIC_OR_DEPARTMENT_OTHER)
Admission: RE | Admit: 2020-08-29 | Discharge: 2020-08-29 | Disposition: A | Discharge status: Home / Self Care | Payer: 59 | Source: Ambulatory Visit | Attending: Family Medicine | Admitting: Family Medicine

## 2020-08-29 ENCOUNTER — Ambulatory Visit (INDEPENDENT_AMBULATORY_CARE_PROVIDER_SITE_OTHER): Payer: 59 | Admitting: Family Medicine

## 2020-08-29 ENCOUNTER — Other Ambulatory Visit: Payer: Self-pay

## 2020-08-29 VITALS — BP 140/88 | HR 104 | Ht 73.0 in | Wt 190.8 lb

## 2020-08-29 DIAGNOSIS — F17200 Nicotine dependence, unspecified, uncomplicated: Secondary | ICD-10-CM | POA: Diagnosis not present

## 2020-08-29 DIAGNOSIS — I10 Essential (primary) hypertension: Secondary | ICD-10-CM

## 2020-08-29 DIAGNOSIS — Z7689 Persons encountering health services in other specified circumstances: Secondary | ICD-10-CM

## 2020-08-29 LAB — CBC WITH DIFFERENTIAL/PLATELET
Abs Immature Granulocytes: 0.04 10*3/uL (ref 0.00–0.07)
Basophils Absolute: 0 10*3/uL (ref 0.0–0.1)
Basophils Relative: 0 %
Eosinophils Absolute: 0.2 10*3/uL (ref 0.0–0.5)
Eosinophils Relative: 2 %
HCT: 41.8 % (ref 39.0–52.0)
Hemoglobin: 14 g/dL (ref 13.0–17.0)
Immature Granulocytes: 0 %
Lymphocytes Relative: 25 %
Lymphs Abs: 2.5 10*3/uL (ref 0.7–4.0)
MCH: 29.8 pg (ref 26.0–34.0)
MCHC: 33.5 g/dL (ref 30.0–36.0)
MCV: 88.9 fL (ref 80.0–100.0)
Monocytes Absolute: 1.1 10*3/uL — ABNORMAL HIGH (ref 0.1–1.0)
Monocytes Relative: 11 %
Neutro Abs: 6.1 10*3/uL (ref 1.7–7.7)
Neutrophils Relative %: 62 %
Platelets: 297 10*3/uL (ref 150–400)
RBC: 4.7 MIL/uL (ref 4.22–5.81)
RDW: 12.4 % (ref 11.5–15.5)
WBC: 10 10*3/uL (ref 4.0–10.5)
nRBC: 0 % (ref 0.0–0.2)

## 2020-08-29 LAB — COMPREHENSIVE METABOLIC PANEL
ALT: 10 U/L (ref 0–44)
AST: 13 U/L — ABNORMAL LOW (ref 15–41)
Albumin: 4.3 g/dL (ref 3.5–5.0)
Alkaline Phosphatase: 62 U/L (ref 38–126)
Anion gap: 9 (ref 5–15)
BUN: 10 mg/dL (ref 6–20)
CO2: 28 mmol/L (ref 22–32)
Calcium: 9.6 mg/dL (ref 8.9–10.3)
Chloride: 100 mmol/L (ref 98–111)
Creatinine, Ser: 0.79 mg/dL (ref 0.61–1.24)
GFR, Estimated: >60 mL/min (ref >60)
Glucose, Bld: 106 mg/dL — ABNORMAL HIGH (ref 70–99)
Potassium: 3.4 mmol/L — ABNORMAL LOW (ref 3.5–5.1)
Sodium: 137 mmol/L (ref 135–145)
Total Bilirubin: 0.8 mg/dL (ref 0.3–1.2)
Total Protein: 7.5 g/dL (ref 6.5–8.1)

## 2020-08-29 LAB — HEMOGLOBIN A1C
Hgb A1c MFr Bld: 5.7 % — ABNORMAL HIGH (ref 4.8–5.6)
Mean Plasma Glucose: 116.89 mg/dL

## 2020-08-29 LAB — LIPID PANEL
Cholesterol: 169 mg/dL (ref 0–200)
HDL: 56 mg/dL (ref >40)
LDL Cholesterol: 99 mg/dL (ref 0–99)
Total CHOL/HDL Ratio: 3 RATIO
Triglycerides: 72 mg/dL (ref <150)
VLDL: 14 mg/dL (ref 0–40)

## 2020-08-29 MED ORDER — HYDROCHLOROTHIAZIDE 12.5 MG PO TABS: 12.5000 mg | ORAL_TABLET | Freq: Every day | ORAL | 0 refills | Status: DC

## 2020-08-29 NOTE — Assessment & Plan Note (Signed)
Chronic, blood pressure improved in office today compared to reading in the emergency room Continue with hydrochlorothiazide, refill sent to pharmacy Adhere to lifestyle modifications, reduced salt intake, work towards gradual increase in weekly physical activity Check labs including CBC, CMP, A1c, lipid panel

## 2020-08-29 NOTE — Progress Notes (Signed)
New Patient Office Visit  Subjective:  Patient ID: Corey Gonzales, male    DOB: 1966/04/04  Age: 55 y.o. MRN: [id number]  CC:  Chief Complaint  Patient presents with  . Establish Care  . Back Pain    Patient complains of lower back pain that radiates around towards front waistline and down into legs  . Knee Pain    Patient complains of left knee pain and swelling intermittently. Doesn't know cause, could be linked to "slip and fall" at work several months ago  . Hypertension    Patient seen in urgent care for lower back pain and was found to have elevated blood pressure. Patient was prescribed HCTZ 12.5 mg  . Urinary Frequency    Patient states that for the past several weeks he has noticed an increase in his urge to urinate. Patient states he is often awaken at night to urinate and towards the end of stream he has a burning or tingling sensation.    HPI Corey Gonzales is a 55 year old male presenting to establish in clinic.  Current concerns related to elevated blood pressure during visit to emergency room.  Elevated blood pressure: Reports being started on hydrochlorothiazide during visit to emergency room last week.  Indicates that he has been on prior blood pressure medication, he thinks about 15 years ago.  Unsure of what the prior blood pressure medication was.  Does have blood pressure cuff somewhat at home, does not check blood pressure regularly at home.  Has been tolerating hydrochlorothiazide without significant issues.  Denies any chest pain, lightheadedness, dizziness, shortness of breath.  Tobacco use: Currently smokes about half a pack per day.  Has smoked since 1987.  Has quit for various periods of time in the past.  Has tried patches before.  Is interested in reducing tobacco use and quitting.  Back pain: Has been a chronic intermittent problem for patient.  Currently doing well.  Back pain was the initial reason patient presented to the emergency room.  Was  prescribed anti-inflammatory and muscle relaxer.  Past Medical History:  Diagnosis Date  . Hypertension     History reviewed. No pertinent surgical history.  Family History  Problem Relation Age of Onset  . Diabetes Mother   . Hypertension Mother   . Emphysema Father   . Cancer Maternal Aunt   . Diabetes Maternal Grandmother   . Diabetes Paternal Grandmother   . Cancer Maternal Aunt     Social History   Socioeconomic History  . Marital status: Married    Spouse name: Not on file  . Number of children: Not on file  . Years of education: Not on file  . Highest education level: Not on file  Occupational History  . Not on file  Tobacco Use  . Smoking status: Current Every Day Smoker    Packs/day: 0.50    Types: Cigarettes  . Smokeless tobacco: Never Used  Vaping Use  . Vaping Use: Never used  Substance and Sexual Activity  . Alcohol use: Yes  . Drug use: No  . Sexual activity: Yes  Other Topics Concern  . Not on file  Social History Narrative  . Not on file   Social Determinants of Health   Financial Resource Strain: Not on file  Food Insecurity: Not on file  Transportation Needs: Not on file  Physical Activity: Not on file  Stress: Not on file  Social Connections: Not on file  Intimate Partner Violence: Not on file  Objective:   Today's Vitals: BP 140/88   Pulse (!) 104   Ht 6\' 1"  (1.854 m)   Wt 190 lb 12.8 oz (86.5 kg)   SpO2 93%   BMI 25.17 kg/m   Physical Exam  Pleasant 55 year old male in no acute distress Cardiovascular exam with regular rate and rhythm, no murmurs appreciated Lungs clear to auscultation bilaterally  Assessment & Plan:   Problem List Items Addressed This Visit      Cardiovascular and Mediastinum   HYPERTENSION, BENIGN ESSENTIAL - Primary    Chronic, blood pressure improved in office today compared to reading in the emergency room Continue with hydrochlorothiazide, refill sent to pharmacy Adhere to lifestyle  modifications, reduced salt intake, work towards gradual increase in weekly physical activity Check labs including CBC, CMP, A1c, lipid panel      Relevant Medications   hydrochlorothiazide (HYDRODIURIL) 12.5 MG tablet   Other Relevant Orders   CBC with Differential/Platelet (Completed)   Comprehensive metabolic panel   Hemoglobin A1c   Lipid panel     Other   Tobacco use disorder    Discussed risks associated with long-term tobacco use including impact on developing and worsening of chronic medical conditions, increased risk of cancer, decreased life expectancy Patient does have an interest in quitting, discussed that we are able to help as needed in working towards smoking cessation       Other Visit Diagnoses    Encounter to establish care with new doctor       Relevant Orders   CBC with Differential/Platelet (Completed)   Comprehensive metabolic panel   Hemoglobin A1c   Lipid panel      Outpatient Encounter Medications as of 08/29/2020  Medication Sig  . acetaminophen (TYLENOL) 325 MG tablet Take 2 tablets (650 mg total) by mouth every 6 (six) hours as needed for moderate pain.  10/29/2020 ibuprofen (ADVIL,MOTRIN) 800 MG tablet Take 1 tablet (800 mg total) by mouth 3 (three) times daily.  Marland Kitchen tiZANidine (ZANAFLEX) 4 MG tablet Take 1 tablet (4 mg total) by mouth at bedtime.  . [DISCONTINUED] hydrochlorothiazide (HYDRODIURIL) 12.5 MG tablet Take 1 tablet (12.5 mg total) by mouth daily.  . hydrochlorothiazide (HYDRODIURIL) 12.5 MG tablet Take 1 tablet (12.5 mg total) by mouth daily.  . [DISCONTINUED] cyclobenzaprine (FLEXERIL) 10 MG tablet Take 1 tablet (10 mg total) by mouth 2 (two) times daily as needed for muscle spasms. (Patient not taking: Reported on 08/29/2020)  . [DISCONTINUED] diclofenac (CATAFLAM) 50 MG tablet Take 1 tablet (50 mg total) by mouth 3 (three) times daily. One tablet TID with food prn pain. (Patient not taking: Reported on 08/29/2020)   No facility-administered encounter  medications on file as of 08/29/2020.   Spent 45 minutes on this patient encounter, including preparation, chart review, face-to-face counseling with patient and coordination of care, and documentation of encounter  Follow-up: Return in about 6 weeks (around 10/10/2020) for Follow Up.  Follow-up on blood pressure, labs, smoking cessation  Libero Puthoff J De 10/12/2020, MD

## 2020-08-29 NOTE — Patient Instructions (Signed)
  Medication Instructions:  Your physician recommends that you continue on your current medications as directed. Please refer to the Current Medication list given to you today. --If you need a refill on any your medications before your next appointment, please call your pharmacy first. If no refills are authorized on file call the office.--  Lab Work: Your physician has recommended that you have lab work today: CBC, HgB A1C, Lipid Profile, and Comprehensive Metabolic Panel If you have labs (blood work) drawn today and your tests are completely normal, you will receive your results only by: Marland Kitchen MyChart Message (if you have MyChart) OR . A phone call from our staff. Please ensure you check your voicemail in the event that you authorized detailed messages to be left on a delegated number. If you have any lab test that is abnormal or we need to change your treatment, we will call you to review the results.  Follow-Up: Your next appointment:   Your physician recommends that you schedule a follow-up appointment in: 6 WEEKS with Dr. de Peru  Thanks for letting us be apart of your health journey!!  Primary Care and Sports Medicine   Dr. de Peru and Shawna Clamp, DNP, AGNP  We recommend signing up for the patient portal called "MyChart".  Sign up information is provided on this After Visit Summary.  MyChart is used to connect with patients for Virtual Visits (Telemedicine).  Patients are able to view lab/test results, encounter notes, upcoming appointments, etc.  Non-urgent messages can be sent to your provider as well.   To learn more about what you can do with MyChart, please visit --  [email or web address].

## 2020-08-29 NOTE — Assessment & Plan Note (Signed)
Discussed risks associated with long-term tobacco use including impact on developing and worsening of chronic medical conditions, increased risk of cancer, decreased life expectancy Patient does have an interest in quitting, discussed that we are able to help as needed in working towards smoking cessation

## 2020-08-31 ENCOUNTER — Telehealth (HOSPITAL_BASED_OUTPATIENT_CLINIC_OR_DEPARTMENT_OTHER): Payer: Self-pay

## 2020-08-31 NOTE — Telephone Encounter (Signed)
Left message for patient to call back for results and recommendations. 

## 2020-08-31 NOTE — Telephone Encounter (Signed)
-----   Message from Hosie Poisson Peru, MD sent at 08/30/2020 12:51 PM EDT ----- Normal white blood cell and red blood cell count with normal hemoglobin.  Electrolytes are largely unremarkable with very slight decrease in potassium below normal range.  Cholesterol panel is normal.  Hemoglobin A1c which measures the average blood sugar over the past 3 months indicates blood sugars being in the "prediabetes" range.  This indicates increased risk of progression to diabetes in the future.  Initial treatment is largely related to lifestyle modifications including dietary changes and increase in physical activity.

## 2020-09-05 NOTE — Telephone Encounter (Signed)
Patient returned phone call to discuss lab results and recommendations Reviewed in detail lab results and recommendations Patient is aware and agreeable and voiced understanding

## 2020-10-10 ENCOUNTER — Ambulatory Visit (HOSPITAL_BASED_OUTPATIENT_CLINIC_OR_DEPARTMENT_OTHER): Payer: 59 | Admitting: Family Medicine

## 2020-10-16 ENCOUNTER — Other Ambulatory Visit (HOSPITAL_BASED_OUTPATIENT_CLINIC_OR_DEPARTMENT_OTHER): Payer: Self-pay | Admitting: Family Medicine

## 2020-10-16 ENCOUNTER — Encounter (HOSPITAL_BASED_OUTPATIENT_CLINIC_OR_DEPARTMENT_OTHER): Payer: Self-pay | Admitting: Family Medicine

## 2020-11-20 ENCOUNTER — Other Ambulatory Visit (HOSPITAL_BASED_OUTPATIENT_CLINIC_OR_DEPARTMENT_OTHER): Payer: Self-pay | Admitting: Family Medicine

## 2020-12-25 ENCOUNTER — Other Ambulatory Visit (HOSPITAL_BASED_OUTPATIENT_CLINIC_OR_DEPARTMENT_OTHER): Payer: Self-pay | Admitting: Family Medicine

## 2021-09-29 ENCOUNTER — Encounter (HOSPITAL_COMMUNITY): Payer: Self-pay | Admitting: Emergency Medicine

## 2021-09-29 ENCOUNTER — Ambulatory Visit (HOSPITAL_COMMUNITY)
Admission: EM | Admit: 2021-09-29 | Discharge: 2021-09-29 | Disposition: A | Discharge status: Home / Self Care | Payer: BC Managed Care – PPO | Attending: Family Medicine | Admitting: Family Medicine

## 2021-09-29 DIAGNOSIS — M545 Low back pain, unspecified: Secondary | ICD-10-CM

## 2021-09-29 DIAGNOSIS — I10 Essential (primary) hypertension: Secondary | ICD-10-CM | POA: Diagnosis not present

## 2021-09-29 MED ORDER — METAXALONE 800 MG PO TABS: 800.0000 mg | ORAL_TABLET | Freq: Three times a day (TID) | ORAL | 0 refills | Status: DC

## 2021-09-29 MED ORDER — MELOXICAM 15 MG PO TABS: 15.0000 mg | ORAL_TABLET | Freq: Every day | ORAL | 0 refills | Status: DC

## 2021-09-29 MED ORDER — KETOROLAC TROMETHAMINE 60 MG/2ML IM SOLN
INTRAMUSCULAR | Status: AC
  Filled 2021-09-29: qty 2

## 2021-09-29 MED ORDER — KETOROLAC TROMETHAMINE 60 MG/2ML IM SOLN
60.0000 mg | Freq: Once | INTRAMUSCULAR | Status: AC
Start: 2021-09-29 — End: 2021-09-29
  Administered 2021-09-29: 60 mg via INTRAMUSCULAR

## 2021-09-29 NOTE — Discharge Instructions (Addendum)
Your blood pressure was noted to be elevated during your visit today. If you are currently taking medication for high blood pressure, please ensure you are taking this as directed. If you do not have a history of high blood pressure and your blood pressure remains persistently elevated, you may need to begin taking a medication at some point. You may return here within the next few days to recheck if unable to see your primary care provider or if you do not have a one.  BP (!) 161/90 (BP Location: Left Arm)   Pulse 98   Temp 98.9 F (37.2 C) (Oral)   Resp 16   SpO2 98%   BP Readings from Last 3 Encounters:  09/29/21 (!) 161/90  08/29/20 140/88  08/22/20 (!) 173/102   Meds ordered this encounter  Medications   metaxalone (SKELAXIN) 800 MG tablet    Sig: Take 1 tablet (800 mg total) by mouth 3 (three) times daily.    Dispense:  21 tablet    Refill:  0   meloxicam (MOBIC) 15 MG tablet    Sig: Take 1 tablet (15 mg total) by mouth daily.    Dispense:  10 tablet    Refill:  0   ketorolac (TORADOL) injection 60 mg

## 2021-09-29 NOTE — ED Triage Notes (Signed)
Lower back pain starting Monday after working in the yard. Using ice, icey/hot without improvement. Denies numbness/tingling, loss of bowel/bladder function.

## 2021-10-01 NOTE — ED Provider Notes (Signed)
[facility]   [id number] 09/29/21 Arrival Time: 1235  ASSESSMENT & PLAN:  1. Acute bilateral low back pain without sciatica   2. Elevated blood pressure reading in office with diagnosis of hypertension    Able to ambulate here and hemodynamically stable. No indication for imaging of back at this time given no trauma and normal neurological exam. Discussed.  Meds ordered this encounter  Medications   metaxalone (SKELAXIN) 800 MG tablet    Sig: Take 1 tablet (800 mg total) by mouth 3 (three) times daily.    Dispense:  21 tablet    Refill:  0   meloxicam (MOBIC) 15 MG tablet    Sig: Take 1 tablet (15 mg total) by mouth daily.    Dispense:  10 tablet    Refill:  0   ketorolac (TORADOL) injection 60 mg   Medication sedation precautions given. Encourage ROM/movement as tolerated.    Discharge Instructions      Your blood pressure was noted to be elevated during your visit today. If you are currently taking medication for high blood pressure, please ensure you are taking this as directed. If you do not have a history of high blood pressure and your blood pressure remains persistently elevated, you may need to begin taking a medication at some point. You may return here within the next few days to recheck if unable to see your primary care provider or if you do not have a one.  BP (!) 161/90 (BP Location: Left Arm)   Pulse 98   Temp 98.9 F (37.2 C) (Oral)   Resp 16   SpO2 98%   BP Readings from Last 3 Encounters:  09/29/21 (!) 161/90  08/29/20 140/88  08/22/20 (!) 173/102    Recommend:  Follow-up Information     de Peru, Buren Kos, MD.   Specialty: Family Medicine Why: If worsening or failing to improve as anticipated. Contact information: [address] [place] [address] [phone]                 Reviewed expectations re: course of current medical issues. Questions answered. Outlined signs and symptoms indicating need for more acute  intervention. Patient verbalized understanding. After Visit Summary given.   SUBJECTIVE: History from: patient.  Corey Gonzales is a 56 y.o. male who presents with complaint of intermittent bilateral lower back discomfort. Onset gradual. First noted  a few d ago . Injury/trama: no trauma but ques relation to working in yard a few d ago. History of back problems requiring medical care: occasional. Pain described as aching and with without radiation. Aggravating factors: certain movements and prolonged walking/standing. Alleviating factors: have not been identified. Progressive LE weakness or saddle anesthesia: none. Extremity sensation changes or weakness: none. Ambulatory without difficulty. Normal bowel/bladder habits: yes; without urinary retention. Normal PO intake without n/v. No associated abdominal pain/n/v. Tylenol without much relief.  Increased blood pressure noted today. Reports that he is treated for HTN. He reports taking medications as instructed, no chest pain on exertion, no dyspnea on exertion, no swelling of ankles, no orthostatic dizziness or lightheadedness, no orthopnea or paroxysmal nocturnal dyspnea, and no palpitations.    OBJECTIVE:  Vitals:   09/29/21 1316  BP: (!) 161/90  Pulse: 98  Resp: 16  Temp: 98.9 F (37.2 C)  TempSrc: Oral  SpO2: 98%    General appearance: alert; no distress HEENT: ; AT Neck: supple with FROM; without midline tenderness CV: regular Lungs: unlabored respirations; speaks full sentences  without difficulty Abdomen: soft, non-tender; non-distended Back: mild  and poorly localized tenderness to palpation over lumbar paraspinal musculature ; FROM at waist; bruising: none; without midline tenderness Extremities: without edema; symmetrical without gross deformities; normal ROM of bilateral LE Skin: warm and dry Neurologic: normal gait; normal sensation and strength of bilateral LE Psychological: alert and cooperative; normal mood  and affect   No Known Allergies  Past Medical History:  Diagnosis Date   Hypertension    Social History   Socioeconomic History   Marital status: Married    Spouse name: Not on file   Number of children: Not on file   Years of education: Not on file   Highest education level: Not on file  Occupational History   Not on file  Tobacco Use   Smoking status: Every Day    Packs/day: 0.50    Types: Cigarettes   Smokeless tobacco: Never  Vaping Use   Vaping Use: Never used  Substance and Sexual Activity   Alcohol use: Yes   Drug use: No   Sexual activity: Yes  Other Topics Concern   Not on file  Social History Narrative   Not on file   Social Determinants of Health   Financial Resource Strain: Not on file  Food Insecurity: Not on file  Transportation Needs: Not on file  Physical Activity: Not on file  Stress: Not on file  Social Connections: Not on file  Intimate Partner Violence: Not on file   Family History  Problem Relation Age of Onset   Diabetes Mother    Hypertension Mother    Emphysema Father    Cancer Maternal Aunt    Diabetes Maternal Grandmother    Diabetes Paternal Grandmother    Cancer Maternal Aunt    History reviewed. No pertinent surgical history.    Mardella Layman, MD 10/01/21 [phone]

## 2022-02-21 ENCOUNTER — Encounter (HOSPITAL_COMMUNITY): Payer: Self-pay

## 2022-02-21 ENCOUNTER — Ambulatory Visit (HOSPITAL_COMMUNITY)
Admission: EM | Admit: 2022-02-21 | Discharge: 2022-02-21 | Disposition: A | Discharge status: Home / Self Care | Payer: BC Managed Care – PPO | Attending: Family Medicine | Admitting: Family Medicine

## 2022-02-21 DIAGNOSIS — Z202 Contact with and (suspected) exposure to infections with a predominantly sexual mode of transmission: Secondary | ICD-10-CM | POA: Diagnosis present

## 2022-02-21 DIAGNOSIS — M545 Low back pain, unspecified: Secondary | ICD-10-CM | POA: Diagnosis present

## 2022-02-21 MED ORDER — METRONIDAZOLE 500 MG PO TABS
2000.0000 mg | ORAL_TABLET | Freq: Once | ORAL | Status: AC
  Administered 2022-02-21: 2000 mg via ORAL

## 2022-02-21 MED ORDER — DICLOFENAC SODIUM 75 MG PO TBEC: 75.0000 mg | DELAYED_RELEASE_TABLET | Freq: Two times a day (BID) | ORAL | 0 refills | Status: AC

## 2022-02-21 MED ORDER — METHOCARBAMOL 500 MG PO TABS: 500.0000 mg | ORAL_TABLET | Freq: Two times a day (BID) | ORAL | 0 refills | Status: AC

## 2022-02-21 MED ORDER — METRONIDAZOLE 500 MG PO TABS
ORAL_TABLET | ORAL | Status: AC
  Filled 2022-02-21: qty 4

## 2022-02-21 NOTE — Discharge Instructions (Signed)

## 2022-02-21 NOTE — ED Provider Notes (Addendum)
[facility]   [id number] 02/21/22 Arrival Time: [id number]  ASSESSMENT & PLAN:  1. Acute bilateral low back pain without sciatica   2. Possible exposure to STD    Able to ambulate here and hemodynamically stable. No indication for imaging of back at this time given no trauma and normal neurological exam. Discussed.' Will tx empirically for trich. Urethral cytology pending.  Meds ordered this encounter  Medications   metroNIDAZOLE (FLAGYL) tablet 2,000 mg   methocarbamol (ROBAXIN) 500 MG tablet    Sig: Take 1 tablet (500 mg total) by mouth 2 (two) times daily.    Dispense:  20 tablet    Refill:  0   diclofenac (VOLTAREN) 75 MG EC tablet    Sig: Take 1 tablet (75 mg total) by mouth 2 (two) times daily.    Dispense:  14 tablet    Refill:  0   Work/school excuse note: provided. Light duty recommended. Medication sedation precautions given. Encourage ROM/movement as tolerated.  Recommend:  Follow-up Information     de [place], Blondell Reveal, MD.   Specialty: Family Medicine Why: If worsening or failing to improve as anticipated. Contact information: [place] [address] [phone]                 Reviewed expectations re: course of current medical issues. Questions answered. Outlined signs and symptoms indicating need for more acute intervention. Patient verbalized understanding. After Visit Summary given.   SUBJECTIVE: History from: patient.  Corey Gonzales is a 56 y.o. male who presents with complaint of bilat lower back pain; x 1 week. No specific injury. Does lift a lot at work; questions relation. Pain comes and goes. No radiation reported. Normal bowel/bladder habits. Normal PO intake without n/v. No associated abdominal pain/n/v. No tx PTA. Reports no chronic steroid use, fevers, IV drug use, or recent back surgeries or procedures.  Also reports possible trich exposure. No symptoms including penile d/c. Desires empiric tx and  STI screening.   OBJECTIVE:  Vitals:   02/21/22 1446  BP: (!) 148/92  Pulse: 87  Resp: 16  Temp: 98.2 F (36.8 C)  TempSrc: Oral  SpO2: 99%    General appearance: alert; no distress Lungs: unlabored respirations; speaks full sentences without difficulty Abdomen: soft, non-tender; non-distended GU: deferred Back: mild  and poorly localized tenderness to palpation over lumbar paraspinal musculature ; FROM at waist; bruising: none; without midline tenderness Extremities: without edema; symmetrical without gross deformities; normal ROM of bilateral LE Skin: warm and dry Neurologic: normal gait; normal sensation and strength of bilateral LE Psychological: alert and cooperative; normal mood and affect  No Known Allergies  Past Medical History:  Diagnosis Date   Hypertension    Social History   Socioeconomic History   Marital status: Married    Spouse name: Not on file   Number of children: Not on file   Years of education: Not on file   Highest education level: Not on file  Occupational History   Not on file  Tobacco Use   Smoking status: Every Day    Packs/day: 0.50    Types: Cigarettes   Smokeless tobacco: Never  Vaping Use   Vaping Use: Never used  Substance and Sexual Activity   Alcohol use: Yes   Drug use: No   Sexual activity: Yes  Other Topics Concern   Not on file  Social History Narrative   Not on file   Social Determinants of Health   Financial Resource  Strain: Not on file  Food Insecurity: Not on file  Transportation Needs: Not on file  Physical Activity: Not on file  Stress: Not on file  Social Connections: Not on file  Intimate Partner Violence: Not on file   Family History  Problem Relation Age of Onset   Diabetes Mother    Hypertension Mother    Emphysema Father    Cancer Maternal Aunt    Diabetes Maternal Grandmother    Diabetes Paternal Grandmother    Cancer Maternal Aunt    History reviewed. No pertinent surgical history.     Vanessa Kick, MD 02/21/22 1517    Vanessa Kick, MD 02/21/22 [phone]

## 2022-02-21 NOTE — ED Triage Notes (Signed)
Pt reports lower back pain for several days. 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 Pt reports no falls or trauma. Pt would like STI testing, he was expose to trich.

## 2022-02-22 LAB — CYTOLOGY, (ORAL, ANAL, URETHRAL) ANCILLARY ONLY
Chlamydia: NEGATIVE
Comment: NEGATIVE
Comment: NEGATIVE
Comment: NORMAL
Neisseria Gonorrhea: NEGATIVE
Trichomonas: NEGATIVE
